# Patient Record
Sex: Male | Born: 1995 | Race: Black or African American | Hispanic: No | Marital: Single | Smoking: Never smoker
Health system: Southern US, Community
[De-identification: ages and names within clinical notes are randomized; demographics above are authoritative.]

## PROBLEM LIST (undated history)

## (undated) HISTORY — PX: HIP SURGERY: SHX245

---
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
## 2022-06-14 ENCOUNTER — Emergency Department (HOSPITAL_BASED_OUTPATIENT_CLINIC_OR_DEPARTMENT_OTHER): Payer: Self-pay

## 2022-06-14 ENCOUNTER — Encounter (HOSPITAL_BASED_OUTPATIENT_CLINIC_OR_DEPARTMENT_OTHER): Payer: Self-pay

## 2022-06-14 ENCOUNTER — Other Ambulatory Visit: Payer: Self-pay

## 2022-06-14 ENCOUNTER — Observation Stay (HOSPITAL_BASED_OUTPATIENT_CLINIC_OR_DEPARTMENT_OTHER)
Admission: EM | Admit: 2022-06-14 | Discharge: 2022-06-15 | Disposition: A | Discharge status: Home / Self Care | Payer: Self-pay | Attending: Otolaryngology | Admitting: Otolaryngology

## 2022-06-14 DIAGNOSIS — S02602A Fracture of unspecified part of body of left mandible, initial encounter for closed fracture: Secondary | ICD-10-CM

## 2022-06-14 DIAGNOSIS — Z9889 Other specified postprocedural states: Secondary | ICD-10-CM

## 2022-06-14 DIAGNOSIS — S02652A Fracture of angle of left mandible, initial encounter for closed fracture: Principal | ICD-10-CM | POA: Insufficient documentation

## 2022-06-14 DIAGNOSIS — S02609A Fracture of mandible, unspecified, initial encounter for closed fracture: Secondary | ICD-10-CM | POA: Diagnosis present

## 2022-06-14 DIAGNOSIS — W548XXA Other contact with dog, initial encounter: Secondary | ICD-10-CM | POA: Insufficient documentation

## 2022-06-14 DIAGNOSIS — Y9359 Activity, other involving other sports and athletics played individually: Secondary | ICD-10-CM | POA: Insufficient documentation

## 2022-06-14 NOTE — ED Notes (Signed)
Per MD, patient is going to go POV to [facility] ER to see ENT.

## 2022-06-14 NOTE — ED Triage Notes (Addendum)
Pt states he was playing with dog today when it hit him in the face. Pt has swollen left jaw.  Dog is quite large, over 100 lbs.
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
## 2022-06-15 ENCOUNTER — Emergency Department (HOSPITAL_COMMUNITY): Payer: Self-pay | Admitting: Registered Nurse

## 2022-06-15 ENCOUNTER — Encounter (HOSPITAL_COMMUNITY)
Admission: EM | Disposition: A | Discharge status: Home / Self Care | Payer: Self-pay | Source: Home / Self Care | Attending: Emergency Medicine

## 2022-06-15 ENCOUNTER — Other Ambulatory Visit: Payer: Self-pay | Admitting: Otolaryngology

## 2022-06-15 ENCOUNTER — Other Ambulatory Visit: Payer: Self-pay

## 2022-06-15 ENCOUNTER — Telehealth (INDEPENDENT_AMBULATORY_CARE_PROVIDER_SITE_OTHER): Payer: Self-pay | Admitting: Otolaryngology

## 2022-06-15 DIAGNOSIS — S02652A Fracture of angle of left mandible, initial encounter for closed fracture: Secondary | ICD-10-CM

## 2022-06-15 DIAGNOSIS — S02609A Fracture of mandible, unspecified, initial encounter for closed fracture: Secondary | ICD-10-CM | POA: Diagnosis present

## 2022-06-15 DIAGNOSIS — Z9889 Other specified postprocedural states: Secondary | ICD-10-CM

## 2022-06-15 DIAGNOSIS — S02602A Fracture of unspecified part of body of left mandible, initial encounter for closed fracture: Secondary | ICD-10-CM

## 2022-06-15 HISTORY — PX: ORIF MANDIBULAR FRACTURE: SHX2127

## 2022-06-15 SURGERY — OPEN REDUCTION INTERNAL FIXATION (ORIF) MANDIBULAR FRACTURE
Anesthesia: General

## 2022-06-15 SURGERY — CLOSED REDUCTION, MANDIBLE, WITH ARCH BAR APPLICATION AND INTERMAXILLARY FIXATION
Anesthesia: General

## 2022-06-15 MED ORDER — BACITRACIN ZINC 500 UNIT/GM EX OINT
TOPICAL_OINTMENT | CUTANEOUS | Status: AC
  Filled 2022-06-15: qty 28.35

## 2022-06-15 MED ORDER — OXYMETAZOLINE HCL 0.05 % NA SOLN
NASAL | Status: AC
  Filled 2022-06-15: qty 30

## 2022-06-15 MED ORDER — KETOROLAC TROMETHAMINE 30 MG/ML IJ SOLN
30.0000 mg | Freq: Once | INTRAMUSCULAR | Status: AC
  Administered 2022-06-15: 30 mg via INTRAVENOUS

## 2022-06-15 MED ORDER — CEPHALEXIN 250 MG/5ML PO SUSR: 500.0000 mg | Freq: Three times a day (TID) | ORAL | 0 refills | Status: AC

## 2022-06-15 MED ORDER — LACTATED RINGERS IV SOLN: INTRAVENOUS | Status: DC | PRN

## 2022-06-15 MED ORDER — FENTANYL CITRATE (PF) 250 MCG/5ML IJ SOLN
INTRAMUSCULAR | Status: AC
  Filled 2022-06-15: qty 5

## 2022-06-15 MED ORDER — CEPHALEXIN 250 MG/5ML PO SUSR: 500.0000 mg | Freq: Three times a day (TID) | ORAL | 0 refills | Status: DC

## 2022-06-15 MED ORDER — PROMETHAZINE HCL 25 MG/ML IJ SOLN: 6.2500 mg | INTRAMUSCULAR | Status: DC | PRN

## 2022-06-15 MED ORDER — LIP MEDEX EX OINT
TOPICAL_OINTMENT | CUTANEOUS | Status: DC | PRN
Start: 2022-06-15 — End: 2022-06-15
  Filled 2022-06-15: qty 7

## 2022-06-15 MED ORDER — PROPOFOL 10 MG/ML IV BOLUS
INTRAVENOUS | Status: DC | PRN
  Administered 2022-06-15: 200 mg via INTRAVENOUS

## 2022-06-15 MED ORDER — SUCCINYLCHOLINE CHLORIDE 200 MG/10ML IV SOSY
PREFILLED_SYRINGE | INTRAVENOUS | Status: DC | PRN
  Administered 2022-06-15: 120 mg via INTRAVENOUS

## 2022-06-15 MED ORDER — ONDANSETRON HCL 4 MG/2ML IJ SOLN: 4.0000 mg | Freq: Four times a day (QID) | INTRAMUSCULAR | Status: DC | PRN

## 2022-06-15 MED ORDER — OXYCODONE-ACETAMINOPHEN 5-325 MG PO TABS: 1.0000 | ORAL_TABLET | ORAL | 0 refills | Status: DC | PRN

## 2022-06-15 MED ORDER — SUGAMMADEX SODIUM 200 MG/2ML IV SOLN
INTRAVENOUS | Status: DC | PRN
  Administered 2022-06-15 (×2): 100 mg via INTRAVENOUS
  Administered 2022-06-15: 50 mg via INTRAVENOUS

## 2022-06-15 MED ORDER — FENTANYL CITRATE (PF) 100 MCG/2ML IJ SOLN
INTRAMUSCULAR | Status: AC
  Filled 2022-06-15: qty 2

## 2022-06-15 MED ORDER — PROPOFOL 10 MG/ML IV BOLUS
INTRAVENOUS | Status: AC
  Filled 2022-06-15: qty 20

## 2022-06-15 MED ORDER — 0.9 % SODIUM CHLORIDE (POUR BTL) OPTIME
TOPICAL | Status: DC | PRN
  Administered 2022-06-15: 1000 mL

## 2022-06-15 MED ORDER — ACETAMINOPHEN 10 MG/ML IV SOLN
INTRAVENOUS | Status: AC
  Filled 2022-06-15: qty 100

## 2022-06-15 MED ORDER — FENTANYL CITRATE (PF) 100 MCG/2ML IJ SOLN
25.0000 ug | INTRAMUSCULAR | Status: DC | PRN
  Administered 2022-06-15 (×2): 50 ug via INTRAVENOUS

## 2022-06-15 MED ORDER — LIDOCAINE 2% (20 MG/ML) 5 ML SYRINGE
INTRAMUSCULAR | Status: DC | PRN
  Administered 2022-06-15: 60 mg via INTRAVENOUS

## 2022-06-15 MED ORDER — OXYMETAZOLINE HCL 0.05 % NA SOLN
NASAL | Status: DC | PRN
  Administered 2022-06-15: 2 via NASAL

## 2022-06-15 MED ORDER — AMISULPRIDE (ANTIEMETIC) 5 MG/2ML IV SOLN: 10.0000 mg | Freq: Once | INTRAVENOUS | Status: DC | PRN

## 2022-06-15 MED ORDER — DEXMEDETOMIDINE (PRECEDEX) IN NS 20 MCG/5ML (4 MCG/ML) IV SYRINGE
PREFILLED_SYRINGE | INTRAVENOUS | Status: DC | PRN
  Administered 2022-06-15: 4 ug via INTRAVENOUS
  Administered 2022-06-15: 8 ug via INTRAVENOUS
  Administered 2022-06-15: 4 ug via INTRAVENOUS
  Administered 2022-06-15 (×3): 8 ug via INTRAVENOUS

## 2022-06-15 MED ORDER — AMISULPRIDE (ANTIEMETIC) 5 MG/2ML IV SOLN
INTRAVENOUS | Status: AC
  Filled 2022-06-15: qty 4

## 2022-06-15 MED ORDER — SODIUM CHLORIDE 0.9 % IV SOLN
1.5000 g | Freq: Four times a day (QID) | INTRAVENOUS | Status: DC
  Administered 2022-06-15: 1.5 g via INTRAVENOUS
  Filled 2022-06-15 (×3): qty 4

## 2022-06-15 MED ORDER — MIDAZOLAM HCL 2 MG/2ML IJ SOLN
INTRAMUSCULAR | Status: AC
  Filled 2022-06-15: qty 2

## 2022-06-15 MED ORDER — FENTANYL CITRATE (PF) 250 MCG/5ML IJ SOLN
INTRAMUSCULAR | Status: DC | PRN
Start: 2022-06-15 — End: 2022-06-15
  Administered 2022-06-15: 150 ug via INTRAVENOUS

## 2022-06-15 MED ORDER — DEXAMETHASONE SODIUM PHOSPHATE 10 MG/ML IJ SOLN
INTRAMUSCULAR | Status: DC | PRN
  Administered 2022-06-15: 5 mg via INTRAVENOUS

## 2022-06-15 MED ORDER — OXYCODONE-ACETAMINOPHEN 5-325 MG PO TABS: 1.0000 | ORAL_TABLET | ORAL | Status: DC | PRN

## 2022-06-15 MED ORDER — OXYCODONE HCL 5 MG PO TABS: 5.0000 mg | ORAL_TABLET | ORAL | 0 refills | Status: DC | PRN

## 2022-06-15 MED ORDER — OXYCODONE HCL 5 MG PO TABS: 5.0000 mg | ORAL_TABLET | Freq: Once | ORAL | Status: DC | PRN

## 2022-06-15 MED ORDER — MIDAZOLAM HCL 2 MG/2ML IJ SOLN
INTRAMUSCULAR | Status: DC | PRN
  Administered 2022-06-15: 2 mg via INTRAVENOUS

## 2022-06-15 MED ORDER — ACETAMINOPHEN 10 MG/ML IV SOLN
1000.0000 mg | Freq: Once | INTRAVENOUS | Status: DC | PRN
  Administered 2022-06-15: 1000 mg via INTRAVENOUS

## 2022-06-15 MED ORDER — ONDANSETRON HCL 4 MG/2ML IJ SOLN
INTRAMUSCULAR | Status: DC | PRN
  Administered 2022-06-15: 4 mg via INTRAVENOUS

## 2022-06-15 MED ORDER — ROCURONIUM BROMIDE 10 MG/ML (PF) SYRINGE
PREFILLED_SYRINGE | INTRAVENOUS | Status: DC | PRN
  Administered 2022-06-15: 20 mg via INTRAVENOUS

## 2022-06-15 MED ORDER — OXYCODONE HCL 5 MG/5ML PO SOLN: 5.0000 mg | Freq: Once | ORAL | Status: DC | PRN

## 2022-06-15 MED ORDER — LIDOCAINE-EPINEPHRINE 1 %-1:100000 IJ SOLN
INTRAMUSCULAR | Status: AC
  Filled 2022-06-15: qty 1

## 2022-06-15 MED ORDER — KETOROLAC TROMETHAMINE 30 MG/ML IJ SOLN
INTRAMUSCULAR | Status: AC
  Filled 2022-06-15: qty 1

## 2022-06-15 SURGICAL SUPPLY — 41 items
BLADE SURG 15 STRL LF DISP TIS (BLADE) IMPLANT
BLADE SURG 15 STRL SS (BLADE)
CANISTER SUCT 3000ML PPV (MISCELLANEOUS) ×1 IMPLANT
CLEANER TIP ELECTROSURG 2X2 (MISCELLANEOUS) ×1 IMPLANT
DRAPE HALF SHEET 40X57 (DRAPES) IMPLANT
ELECT COATED BLADE 2.86 ST (ELECTRODE) ×1 IMPLANT
ELECT NDL TIP 2.8 STRL (NEEDLE) IMPLANT
ELECT NEEDLE TIP 2.8 STRL (NEEDLE) IMPLANT
ELECT REM PT RETURN 9FT ADLT (ELECTROSURGICAL) ×1
ELECTRODE REM PT RTRN 9FT ADLT (ELECTROSURGICAL) ×1 IMPLANT
GLOVE BIO SURGEON STRL SZ7 (GLOVE) ×1 IMPLANT
GOWN STRL REUS W/ TWL LRG LVL3 (GOWN DISPOSABLE) ×2 IMPLANT
GOWN STRL REUS W/TWL LRG LVL3 (GOWN DISPOSABLE) ×2
KIT BASIN OR (CUSTOM PROCEDURE TRAY) ×1 IMPLANT
KIT TURNOVER KIT B (KITS) ×1 IMPLANT
NDL HYPO 25GX1X1/2 BEV (NEEDLE) IMPLANT
NEEDLE HYPO 25GX1X1/2 BEV (NEEDLE) IMPLANT
NS IRRIG 1000ML POUR BTL (IV SOLUTION) ×1 IMPLANT
PAD ARMBOARD 7.5X6 YLW CONV (MISCELLANEOUS) ×2 IMPLANT
PATTIES SURGICAL .5 X3 (DISPOSABLE) IMPLANT
PENCIL BUTTON HOLSTER BLD 10FT (ELECTRODE) IMPLANT
PENCIL SMOKE EVACUATOR (MISCELLANEOUS) ×1 IMPLANT
SCISSORS WIRE ANG 4 3/4 DISP (INSTRUMENTS) ×1 IMPLANT
SCREW UPPER FACE 2.0X12MM (Screw) IMPLANT
SCREW UPPER FACE 2.0X8MM (Screw) IMPLANT
STAPLER VISISTAT 35W (STAPLE) ×1 IMPLANT
SUT BONE WAX W31G (SUTURE) IMPLANT
SUT CHROMIC 3 0 SH 27 (SUTURE) ×1 IMPLANT
SUT ETHILON 3 0 PS 1 (SUTURE) IMPLANT
SUT SILK 3 0 (SUTURE)
SUT SILK 3 0 SH 30 (SUTURE) ×1 IMPLANT
SUT SILK 3-0 18XBRD TIE 12 (SUTURE) IMPLANT
SUT STEEL 0 (SUTURE)
SUT STEEL 0 18XMFL TIE 17 (SUTURE) IMPLANT
SUT STEEL 2 (SUTURE) IMPLANT
SUT VIC AB 3-0 FS2 27 (SUTURE) IMPLANT
SUT VIC AB 4-0 P-3 18X BRD (SUTURE) IMPLANT
SUT VIC AB 4-0 P3 18 (SUTURE)
TOWEL GREEN STERILE FF (TOWEL DISPOSABLE) ×1 IMPLANT
TRAY ENT MC OR (CUSTOM PROCEDURE TRAY) ×1 IMPLANT
WATER STERILE IRR 1000ML POUR (IV SOLUTION) ×1 IMPLANT

## 2022-06-15 NOTE — Discharge Summary (Signed)
Physician Discharge Summary  Patient ID: James Holder MRN: [id number] DOB/AGE: 06-28-1996 28 y.o.  Admit date: 06/14/2022 Discharge date: 06/15/2022  Admission Diagnoses: Mandible fracture   Discharge Diagnoses:  Principal Problem:   Status post surgery Active Problems:   Closed fracture of left side of mandibular body (HCC)   Mandible fracture [facility])   Discharged Condition: good  Hospital Course: Same patient was admitted for observation after closed reduction of mandible fracture with a body fracture is nondisplaced.  This morning the patient is doing great and pain is controlled.  He has good occlusion.  He has no breathing problems.  No excessive swelling.  He was discharged given instructions regarding wire cutters to keep with him at all times and follow-up in 1 week  Consults: None  Significant Diagnostic Studies: None  Treatments: Closed reduction of mandible fracture  Discharge Exam: Blood pressure 138/89, pulse (!) 55, temperature 98.5 F (36.9 C), temperature source Oral, resp. rate 17, height 6' (1.829 m), weight 122.5 kg, SpO2 99 %. He is awake and alert.  Speech is good.  No airway problems.  His wires are tight and are intact.  No excessive facial swelling.  Lungs are clear.  Heart is regular.  Abdomen soft.  No swelling, tenderness of the extremities  Disposition:   Discharged to home.  Follow-up in 1 week.  Keep wire cutters with him at all times.      Signed: Suzanna Obey 06/15/2022, 10:37 AM

## 2022-06-15 NOTE — ED Notes (Signed)
Signature obtained by pt for POV transfer. Pt provided with instructions on how to get to [facility] and procedure upon arrival. Patient provided with accompanying paperwork.

## 2022-06-15 NOTE — Anesthesia Procedure Notes (Signed)
Procedure Name: Intubation Date/Time: 06/15/2022 3:39 AM  Performed by: Trinna Post., CRNAPre-anesthesia Checklist: Patient identified, Emergency Drugs available, Suction available, Patient being monitored and Timeout performed Patient Re-evaluated:Patient Re-evaluated prior to induction Oxygen Delivery Method: Circle system utilized Preoxygenation: Pre-oxygenation with 100% oxygen Induction Type: IV induction, Rapid sequence and Cricoid Pressure applied Laryngoscope Size: Mac and 3 Grade View: Grade I Nasal Tubes: Right, Nasal prep performed, Nasal Rae and Magill forceps- large, utilized Tube size: 7.5 mm Number of attempts: 1 Placement Confirmation: ETT inserted through vocal cords under direct vision, positive ETCO2 and breath sounds checked- equal and bilateral Tube secured with: Tape Dental Injury: Teeth and Oropharynx as per pre-operative assessment

## 2022-06-15 NOTE — ED Provider Notes (Signed)
Received in transfer from [facility] to be seen by ENT.   EOMI RRR CTAB  NABS  Stable at this time.  Pending procedure   James Kapusta, MD 06/15/22 0130

## 2022-06-15 NOTE — ED Notes (Addendum)
Per Redge Gainer patient did not arrive at facility. This RN and charge RN attempted to call patient. No answer at this time and no availability to leave a message.
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
## 2022-06-15 NOTE — Anesthesia Preprocedure Evaluation (Signed)
Anesthesia Evaluation  Patient identified by MRN, date of birth, ID band Patient awake    Reviewed: Allergy & Precautions, NPO status , Patient's Chart, lab work & pertinent test results  Airway Mallampati: IV  TM Distance: >3 FB Neck ROM: Full  Mouth opening: Limited Mouth Opening  Dental   Pulmonary neg pulmonary ROS,    Pulmonary exam normal        Cardiovascular negative cardio ROS Normal cardiovascular exam     Neuro/Psych negative neurological ROS     GI/Hepatic negative GI ROS, (+)     substance abuse  ,   Endo/Other  negative endocrine ROS  Renal/GU negative Renal ROS     Musculoskeletal   Abdominal (+) + obese,   Peds  Hematology negative hematology ROS (+)   Anesthesia Other Findings MANDIBULAR FRACTURE  Reproductive/Obstetrics                             Anesthesia Physical Anesthesia Plan  ASA: 2  Anesthesia Plan: General   Post-op Pain Management:    Induction: Intravenous and Rapid sequence  PONV Risk Score and Plan: 2 and Ondansetron, Dexamethasone, Midazolam and Treatment may vary due to age or medical condition  Airway Management Planned: Nasal ETT  Additional Equipment:   Intra-op Plan:   Post-operative Plan: Extubation in OR  Informed Consent: I have reviewed the patients History and Physical, chart, labs and discussed the procedure including the risks, benefits and alternatives for the proposed anesthesia with the patient or authorized representative who has indicated his/her understanding and acceptance.     Dental advisory given  Plan Discussed with: CRNA  Anesthesia Plan Comments:         Anesthesia Quick Evaluation

## 2022-06-15 NOTE — Progress Notes (Signed)
Pacu RN Report to floor given  Gave report toVictoria RN. Room: [id number]    Discussed surgery, meds given in OR and Pacu, VS, IV fluids given, EBL, urine output, pain and other pertinent information. Also discussed if pt had any family or friends here or belongings with them.   Pt has jaw wired shut, wire cutters taped to bedside rail. Pain treated w/ Ofirmev, Toradol and Fentanyl as documented in MAR. Pt sleeping off/on, easily arousable.   Sent text message to cousin Dorathy Daft [phone]  Pt exits my care.

## 2022-06-15 NOTE — Telephone Encounter (Signed)
I tried to change his pharmacy but could not as epic would not allow it despite multiple different attempts.  He could not get narcotic through phone so I gave him tramadol until tomorrow.

## 2022-06-15 NOTE — Telephone Encounter (Signed)
Patient left the [facility] ER 65 minutes ago en route to [facility] to meet me to repair his mandible fracture.  He has still not arrived. I called [facility], and they have not heard back from him.  I called the cell number listed in his chart, and there was no answer.  I have been here at the ER waiting for him for 45 minutes - will wait another 10 minutes in hopes that he will arrive.

## 2022-06-15 NOTE — Transfer of Care (Signed)
Immediate Anesthesia Transfer of Care Note  Patient: James Holder  Procedure(s) Performed: MANDIBULAR MAXILLARY FRACTURE  Patient Location: PACU  Anesthesia Type:General  Level of Consciousness: drowsy  Airway & Oxygen Therapy: Patient Spontanous Breathing and Patient connected to face mask oxygen  Post-op Assessment: Report given to RN and Post -op Vital signs reviewed and stable  Post vital signs: Reviewed and stable  Last Vitals:  Vitals Value Taken Time  BP    Temp    Pulse    Resp    SpO2      Last Pain:  Vitals:   06/15/22 0118  TempSrc: Oral  PainSc:          Complications: No notable events documented.

## 2022-06-15 NOTE — Op Note (Signed)
Procedure(s): MANDIBULAR MAXILLARY FRACTURE  James Holder male 26 y.o. 06/15/2022  Surgeon(s) and Role:    * Vilma Will, Alycia Rossetti., MD - Primary   Indications: This patient has a nondisplaced single fracture at the angle and ramus of the left mandible.  We considered open reduction but finally elected on MMF alone given the single nondisplaced nature of the injury.  Consent was obtained.     Surgeon: Trixie Dredge.   Assistants: None  Anesthesia: General endotracheal anesthesia   Procedure Detail  Procedure: Maxillomandibular fixation  Pre and postoperative diagnosis: Left mandibular angle fracture, nondisplaced  Findings:  Patient was taken the operating room but sleep and nasotracheally intubated without difficulty.  The face and mouth were prepped using Betadine solution.  The eyes were taped and covered in eye pads.  I placed 4 MMF self-tapping screws in the maxilla and mandible.  I used 12 mm screws in the maxilla, placing them well above the tooth roots.  I placed 9 mm screws into the mandible between the tooth roots and inferior to them.  I then used 22-gauge wire x 4 to reduce his bite into its premorbid position.  The stomach was emptied free of secretions.  He tolerated the procedure well and I was at the bedside during extubation.   Estimated Blood Loss:  Minimal               Complications:  * No complications entered in OR log *         Disposition: PACU - hemodynamically stable.         Condition: stable

## 2022-06-15 NOTE — Anesthesia Postprocedure Evaluation (Signed)
Anesthesia Post Note  Patient: TYMERE DEPUY  Procedure(s) Performed: MANDIBULAR MAXILLARY FRACTURE     Patient location during evaluation: PACU Anesthesia Type: General Level of consciousness: awake Pain management: pain level controlled Vital Signs Assessment: post-procedure vital signs reviewed and stable Respiratory status: spontaneous breathing, nonlabored ventilation, respiratory function stable and patient connected to nasal cannula oxygen Cardiovascular status: blood pressure returned to baseline and stable Postop Assessment: no apparent nausea or vomiting Anesthetic complications: no   No notable events documented.  Last Vitals:  Vitals:   06/15/22 0555 06/15/22 0615  BP:  (!) 145/93  Pulse: (!) 56 63  Resp: 18 16  Temp:  36.4 C  SpO2: 94% 99%    Last Pain:  Vitals:   06/15/22 0615  TempSrc: Oral  PainSc:                  Catheryn Bacon Izaih Kataoka

## 2022-06-16 ENCOUNTER — Other Ambulatory Visit (INDEPENDENT_AMBULATORY_CARE_PROVIDER_SITE_OTHER): Payer: Self-pay | Admitting: Otolaryngology

## 2022-06-16 MED ORDER — OXYCODONE HCL 5 MG PO TABS: 5.0000 mg | ORAL_TABLET | ORAL | 0 refills | Status: AC | PRN

## 2022-06-16 NOTE — Progress Notes (Signed)
Called in his narcotic today with the office computer that allowed itt to go through

## 2022-06-18 ENCOUNTER — Encounter (HOSPITAL_COMMUNITY): Payer: Self-pay | Admitting: Otolaryngology

## 2022-06-26 ENCOUNTER — Ambulatory Visit (INDEPENDENT_AMBULATORY_CARE_PROVIDER_SITE_OTHER): Payer: Self-pay | Admitting: Physician Assistant

## 2022-06-26 VITALS — BP 124/77 | HR 84 | Temp 98.2°F | Resp 16 | Ht 72.0 in | Wt 254.4 lb

## 2022-06-26 DIAGNOSIS — Z09 Encounter for follow-up examination after completed treatment for conditions other than malignant neoplasm: Secondary | ICD-10-CM

## 2022-06-26 DIAGNOSIS — S02602D Fracture of unspecified part of body of left mandible, subsequent encounter for fracture with routine healing: Secondary | ICD-10-CM

## 2022-06-26 NOTE — Progress Notes (Signed)
Patient ID: James Holder, male   DOB: [birth year], 26 y.o.   MRN: [id number]   James Holder, is a 26 y.o. male  TMH:[id number]  [id number]  DOB - 07-03-1996  No chief complaint on file.      Subjective:   James Holder is a 26 y.o. male here today for a follow up visit and to establish care After hospitalization for stabilization and surgery for mandibular fracture that had occurred some days before.  He has not seen Dr Jearld Fenton in f/up yet but discharge summary said he should be seeing them within one week.  He is eating soups and drinking ensures, atkins protein drinks, etc.  No fevers.  Pain is improving    Admit date: 06/14/2022 Discharge date: 06/15/2022   Admission Diagnoses: Mandible fracture    Discharge Diagnoses:  Principal Problem:   Status post surgery Active Problems:   Closed fracture of left side of mandibular body (HCC)   Mandible fracture [facility])     Discharged Condition: good   Hospital Course: Same patient was admitted for observation after closed reduction of mandible fracture with a body fracture is nondisplaced.  This morning the patient is doing great and pain is controlled.  He has good occlusion.  He has no breathing problems.  No excessive swelling.  He was discharged given instructions regarding wire cutters to keep with him at all times and follow-up in 1 week   No problems updated.  ALLERGIES: No Known Allergies  PAST MEDICAL HISTORY: No past medical history on file.  MEDICATIONS AT HOME: Prior to Admission medications   Medication Sig Start Date End Date Taking? Authorizing Provider  ibuprofen (ADVIL,MOTRIN) 200 MG tablet Take 200 mg by mouth every 6 (six) hours as needed. For pain    [provider]  oxyCODONE (ROXICODONE) 5 MG immediate release tablet Take 1 tablet (5 mg total) by mouth every 4 (four) hours as needed for severe pain. 06/16/22   Suzanna Obey, MD    ROS: Neg HEENT Neg resp Neg cardiac Neg GI Neg GU Neg  MS Neg psych Neg neuro  Objective:   Vitals:   06/26/22 1337  BP: 124/77  Pulse: 84  Resp: 16  Temp: 98.2 F (36.8 C)  TempSrc: Tympanic  SpO2: 96%  Weight: 254 lb 6.4 oz (115.4 kg)  Height: 6' (1.829 m)   Exam General appearance : Awake, alert, not in any distress. Speech Clear. Not toxic looking HEENT: Atraumatic and Normocephalic. No redness of incision sites. Neck: Supple, no JVD. No cervical lymphadenopathy.  Chest: Good air entry bilaterally, CTAB.  No rales/rhonchi/wheezing CVS: S1 S2 regular, no murmurs.  Extremities: B/L Lower Ext shows no edema, both legs are warm to touch Neurology: Awake alert, and oriented X 3, CN II-XII intact, Non focal Skin: No Rash  Data Review No results found for: "HGBA1C"  Assessment & Plan   1. Closed fracture of left side of mandibular body with routine healing, subsequent encounter Continue soft foods-urgent referrals placed to get him into ent asap - Ambulatory referral to ENT  2. Hospital discharge follow-up    Return in about 2 months (around 08/26/2022) for assign PCP.  The patient was given clear instructions to go to ER or return to medical center if symptoms don't improve, worsen or new problems develop. The patient verbalized understanding. The patient was told to call to get lab results if they haven't heard anything in the next week.      Georgian Co, PA-C [facility], [place]   06/26/2022, 1:57 PM

## 2022-06-30 ENCOUNTER — Telehealth: Payer: Self-pay | Admitting: Physician Assistant

## 2022-06-30 NOTE — Telephone Encounter (Signed)
Copied from [place] [id number]. Topic: Complaint - Care >> Jun 26, 2022  4:25 PM Leilani Able wrote: Date of Incident: 9/21 Details of complaint: Pt had a HFU today 9/21 at [facility] / Pt has called [facility] for a HFU and states that he did not have FU today at [facility],  states no one looked at his recent surgery site today. Office called need to forward to admin. How would the patient like to see it resolved? FU with PT.Marland Kitchen administrator at another office Pt contact [phone]  Route to Engineer, building services.

## 2022-06-30 NOTE — Telephone Encounter (Signed)
Called and spoke with patient. In discussing with the patient what a HFU appointment was, he felt that it was not what he thought it would be. He said it was mentioned about obtaining a PCP as well, but to him, that is not what he thought the appointment should be about. I discussed the providers notes re: the appointment, but the patient felt that she should have checked the wires in his mouth to see if they were loose or not and he said that nothing was mentioned to him about "wire cutters" as notated in the documentation. I explained to him the purposes of a HFU, but that we are not Specialists or surgeons and we strive to ensure that the patient is evaluated for any needs that they may have from their recent Hospital or ED visit. I did mentioned to him that he had a follow up appointment on 9-28 with an ENT provider at [facility] and he said he wasn't aware of that appointment. I gave him the # to call that clinic. I apologized if he felt he did not get the care that he thought he would be getting, but that we would be happy to become his PCP moving forward.

## 2022-07-14 ENCOUNTER — Ambulatory Visit (INDEPENDENT_AMBULATORY_CARE_PROVIDER_SITE_OTHER): Payer: Self-pay | Admitting: Otolaryngology

## 2022-07-14 DIAGNOSIS — S02652D Fracture of angle of left mandible, subsequent encounter for fracture with routine healing: Secondary | ICD-10-CM

## 2022-07-14 DIAGNOSIS — S02602G Fracture of unspecified part of body of left mandible, subsequent encounter for fracture with delayed healing: Secondary | ICD-10-CM

## 2022-07-14 NOTE — Addendum Note (Signed)
Addended byPenni Bombard, Melvena Vink on: 07/14/2022 11:35 AM   Modules accepted: Orders

## 2022-07-14 NOTE — Progress Notes (Signed)
James Holder is an 26 y.o. male.   Chief Complaint: mandible fractiure HPI: He is a patient that was put in MMF by Dr. Marcelline Deist 4 weeks ago.  He was done with bicortical screws.  He cut his wires on Friday.  He is having pain in the left molar area.  No excessive swelling.  He has malocclusion.  No past medical history on file.  Past Surgical History:  Procedure Laterality Date   HIP SURGERY     ORIF MANDIBULAR FRACTURE N/A 06/15/2022   Procedure: MANDIBULAR MAXILLARY FRACTURE;  Surgeon: Boyce Medici., MD;  Location: [place];  Service: ENT;  Laterality: N/A;    No family history on file. Social History:  reports that he has never smoked. He does not have any smokeless tobacco history on file. He reports current alcohol use. He reports current drug use. Drug: Marijuana.  Allergies: No Known Allergies  (Not in a hospital admission)   No results found for this or any previous visit (from the past 48 hour(s)). No results found.   There were no vitals taken for this visit.  PHYSICAL EXAM: Appearance _ awake alert with no distress.  Head- atraumatic and no obvious abnormalities Eyes- PERRL, EOMI, no conjunctiva injection or ecchymosis Ears-  Right- Pinna without inflammation or swelling. canal without obstruction or injury. TM within normal limits  Left- Pinna without inflammation or swelling. canal without obstruction or injury. TM within normal limits Nose- no lesions or masses.  Oc/OP- no lesions or excessive swelling.  He has no trismus.  Still has the remnants of the wires inferiorly.  There is no excessive swelling.  There is pain by palpation to the left angle of the mandible.  His occlusion is a open bite Hp/Larynx- normal voice and no airway issues. No swelling or lesions Neck- no mass or lesions. Normal movement.  Neuro- CNII-XII intact, no sensory deficits.  Lungs- normal effort no distress noted     Assessment/Plan Mandible fracture-he has poor  healing most likely secondary to the molar that was fractured.  He needs this addressed by oral surgery with extraction and probably additional surgical intervention to get his occlusion correct.  I have contacted [facility] as to how we are supposed to obtain oral surgery given there is no one on-call for the hospital.  I may have to refer the patient to [facility] if [facility] cannot solve this issue.  I will call the patient in the next 1 to 2 days to advise him where to go.  Melissa Montane 07/14/2022, 9:34 AM

## 2022-07-17 ENCOUNTER — Ambulatory Visit: Payer: Self-pay | Admitting: *Deleted
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
## 2022-09-01 ENCOUNTER — Ambulatory Visit: Payer: Self-pay | Admitting: Family Medicine

## 2023-02-24 ENCOUNTER — Other Ambulatory Visit: Payer: Self-pay

## 2023-02-24 ENCOUNTER — Emergency Department (HOSPITAL_COMMUNITY)
Admission: EM | Admit: 2023-02-24 | Discharge: 2023-02-24 | Disposition: A | Discharge status: Home / Self Care | Payer: BC Managed Care – PPO | Attending: Emergency Medicine | Admitting: Emergency Medicine

## 2023-02-24 ENCOUNTER — Encounter (HOSPITAL_COMMUNITY): Payer: Self-pay

## 2023-02-24 ENCOUNTER — Emergency Department (HOSPITAL_COMMUNITY): Payer: BC Managed Care – PPO

## 2023-02-24 DIAGNOSIS — Y92410 Unspecified street and highway as the place of occurrence of the external cause: Secondary | ICD-10-CM | POA: Diagnosis not present

## 2023-02-24 DIAGNOSIS — M546 Pain in thoracic spine: Secondary | ICD-10-CM | POA: Insufficient documentation

## 2023-02-24 DIAGNOSIS — M25512 Pain in left shoulder: Secondary | ICD-10-CM | POA: Diagnosis present

## 2023-02-24 MED ORDER — ACETAMINOPHEN 325 MG PO TABS
650.0000 mg | ORAL_TABLET | Freq: Four times a day (QID) | ORAL | Status: DC | PRN
  Administered 2023-02-24: 650 mg via ORAL
  Filled 2023-02-24: qty 2

## 2023-02-24 MED ORDER — CYCLOBENZAPRINE HCL 10 MG PO TABS: 10.0000 mg | ORAL_TABLET | Freq: Two times a day (BID) | ORAL | 0 refills | Status: AC | PRN

## 2023-02-24 NOTE — Discharge Instructions (Signed)
Your x-ray today did not show any evidence of fracture. Please take your medications as prescribed. Take tylenol/ibuprofen up to 3 times a day and Flexeril for pain. I recommend close follow-up with PCP for reevaluation.  Please do not hesitate to return to emergency department if worrisome signs symptoms we discussed become apparent.
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
